# Patient Record
Sex: Male | Born: 2000 | Race: White | Hispanic: No | Marital: Single | Smoking: Never smoker
Health system: Southern US, Community
[De-identification: ages and names within clinical notes are randomized; demographics above are authoritative.]

---
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
## 2019-02-26 ENCOUNTER — Encounter: Payer: Self-pay | Admitting: Emergency Medicine

## 2019-02-26 ENCOUNTER — Other Ambulatory Visit: Payer: Self-pay

## 2019-02-26 ENCOUNTER — Emergency Department (INDEPENDENT_AMBULATORY_CARE_PROVIDER_SITE_OTHER)
Admission: EM | Admit: 2019-02-26 | Discharge: 2019-02-26 | Disposition: A | Discharge status: Home / Self Care | Payer: BLUE CROSS/BLUE SHIELD | Source: Home / Self Care | Attending: Family Medicine | Admitting: Family Medicine

## 2019-02-26 ENCOUNTER — Emergency Department (INDEPENDENT_AMBULATORY_CARE_PROVIDER_SITE_OTHER): Payer: BLUE CROSS/BLUE SHIELD

## 2019-02-26 DIAGNOSIS — R059 Cough, unspecified: Secondary | ICD-10-CM

## 2019-02-26 DIAGNOSIS — R05 Cough: Secondary | ICD-10-CM

## 2019-02-26 DIAGNOSIS — R69 Illness, unspecified: Secondary | ICD-10-CM

## 2019-02-26 DIAGNOSIS — J111 Influenza due to unidentified influenza virus with other respiratory manifestations: Secondary | ICD-10-CM

## 2019-02-26 DIAGNOSIS — R509 Fever, unspecified: Secondary | ICD-10-CM

## 2019-02-26 LAB — POCT INFLUENZA A/B
Influenza A, POC: NEGATIVE
Influenza B, POC: NEGATIVE

## 2019-02-26 MED ORDER — OSELTAMIVIR PHOSPHATE 75 MG PO CAPS: 75.0000 mg | ORAL_CAPSULE | Freq: Two times a day (BID) | ORAL | 0 refills | Status: DC

## 2019-02-26 NOTE — Discharge Instructions (Addendum)
Take plain guaifenesin (1200mg  extended release tabs such as Mucinex) twice daily, with plenty of water, for cough and congestion.  May add Pseudoephedrine (30mg , one or two every 4 to 6 hours) for sinus congestion.  Get adequate rest.   May use Afrin nasal spray (or generic oxymetazoline) each morning for about 5 days and then discontinue.  Also recommend using saline nasal spray several times daily and saline nasal irrigation (AYR is a common brand).  Use Flonase nasal spray each morning after using Afrin nasal spray and saline nasal irrigation. Try warm salt water gargles for sore throat.  Stop all antihistamines for now, and other non-prescription cough/cold preparations. May take Ibuprofen 200mg , 4 tabs every 8 hours with food for body aches, fever, etc. May take Delsym Cough Suppressant at bedtime for nighttime cough.   Recommend a flu shot when well.

## 2019-02-26 NOTE — ED Provider Notes (Signed)
[name]    CSN: [id number] Arrival date & time: 02/26/19  1140     History   Chief Complaint Chief Complaint  Patient presents with  . Cough    HPI Terry Joseph is a 18 y.o. male.   Patient awoke yesterday with a partly productive cough and fatigue.  He has developed fever to 102/chills, nasal congestion, and myalgias.  He denies pleuritic pain or shortness of breath.  He has a history of seasonal rhinitis.  The history is provided by the patient.    Past medical history negative  Active problems:  none    Surgical history:  none     Home Medications    Prior to Admission medications   Medication Sig Start Date End Date Taking? Authorizing Provider  oseltamivir (TAMIFLU) 75 MG capsule Take 1 capsule (75 mg total) by mouth every 12 (twelve) hours. 02/26/19   Lattie Haw, MD    Family History Family History  Problem Relation Age of Onset  . Hypertension Mother     Social History Social History   Tobacco Use  . Smoking status: Never Smoker  . Smokeless tobacco: Never Used  Substance Use Topics  . Alcohol use: Never    Frequency: Never  . Drug use: Never     Allergies   Patient has no allergy information on record.   Review of Systems Review of Systems No sore throat + cough No pleuritic pain No wheezing + nasal congestion + post-nasal drainage No sinus pain/pressure No itchy/red eyes No earache No hemoptysis No SOB + fever, + chills No nausea No vomiting No abdominal pain No diarrhea No urinary symptoms No skin rash + fatigue + myalgias No headache Used OTC meds without relief   Physical Exam Triage Vital Signs ED Triage Vitals  Enc Vitals Group     BP 02/26/19 1208 119/77     Pulse Rate 02/26/19 1208 78     Resp --      Temp 02/26/19 1208 98.9 F (37.2 C)     Temp Source 02/26/19 1208 Oral     SpO2 02/26/19 1208 100 %     Weight 02/26/19 1214 130 lb (59 kg)     Height 02/26/19 1214 5\' 9"  (1.753  m)     Head Circumference --      Peak Flow --      Pain Score 02/26/19 1214 2     Pain Loc --      Pain Edu? --      Excl. in GC? --    No data found.  Updated Vital Signs BP 119/77 (BP Location: Right Arm)   Pulse 78   Temp 98.9 F (37.2 C) (Oral)   Ht 5\' 9"  (1.753 m)   Wt 59 kg   SpO2 100%   BMI 19.20 kg/m   Visual Acuity Right Eye Distance:   Left Eye Distance:   Bilateral Distance:    Right Eye Near:   Left Eye Near:    Bilateral Near:     Physical Exam Nursing notes and Vital Signs reviewed. Appearance:  Patient appears stated age, and in no acute distress Eyes:  Pupils are equal, round, and reactive to light and accomodation.  Extraocular movement is intact.  Conjunctivae are not inflamed  Ears:  Canals normal.  Tympanic membranes normal.  Nose:  Mildly congested turbinates.  No sinus tenderness.   Pharynx:  Normal Neck:  Supple.  Enlarged posterior/lateral nodes are palpated bilaterally, tender  to palpation on the left.   Lungs:  Clear to auscultation.  Breath sounds are equal.  Moving air well. Heart:  Regular rate and rhythm without murmurs, rubs, or gallops.  Abdomen:  Nontender without masses or hepatosplenomegaly.  Bowel sounds are present.  No CVA or flank tenderness.  Extremities:  No edema.  Skin:  No rash present.    UC Treatments / Results  Labs (all labs ordered are listed, but only abnormal results are displayed) Labs Reviewed  NOVEL CORONAVIRUS, NAA (HOSPITAL ORDER, SEND-OUT TO REF LAB)  POCT INFLUENZA A/B negative    EKG None  Radiology Dg Chest 2 View  Result Date: 02/26/2019 CLINICAL DATA:  Cough and fever EXAM: CHEST - 2 VIEW COMPARISON:  None. FINDINGS: Lungs are clear. Heart size and pulmonary vascularity are normal. No adenopathy. There is midthoracic levoscoliosis. IMPRESSION: No edema or consolidation. Electronically Signed   By: Bretta Bang III M.D.   On: 02/26/2019 12:59    Procedures Procedures (including critical  care time)  Medications Ordered in UC Medications - No data to display  Initial Impression / Assessment and Plan / UC Course  I have reviewed the triage vital signs and the nursing notes.  Pertinent labs & imaging results that were available during my care of the patient were reviewed by me and considered in my medical decision making (see chart for details).    Patient meets qualifications for COVID-19 testing and PUI. Begin Tamiflu Call PCP if not improved one week.    Final Clinical Impressions(s) / UC Diagnoses   Final diagnoses:  Cough  Influenza-like illness     Discharge Instructions     Take plain guaifenesin (1200mg  extended release tabs such as Mucinex) twice daily, with plenty of water, for cough and congestion.  May add Pseudoephedrine (30mg , one or two every 4 to 6 hours) for sinus congestion.  Get adequate rest.   May use Afrin nasal spray (or generic oxymetazoline) each morning for about 5 days and then discontinue.  Also recommend using saline nasal spray several times daily and saline nasal irrigation (AYR is a common brand).  Use Flonase nasal spray each morning after using Afrin nasal spray and saline nasal irrigation. Try warm salt water gargles for sore throat.  Stop all antihistamines for now, and other non-prescription cough/cold preparations. May take Ibuprofen 200mg , 4 tabs every 8 hours with food for body aches, fever, etc. May take Delsym Cough Suppressant at bedtime for nighttime cough.   Recommend a flu shot when well.     ED Prescriptions    Medication Sig Dispense Auth. Provider   oseltamivir (TAMIFLU) 75 MG capsule Take 1 capsule (75 mg total) by mouth every 12 (twelve) hours. 10 capsule Lattie Haw, MD         Lattie Haw, MD 02/27/19 [phone]

## 2019-02-26 NOTE — ED Triage Notes (Signed)
Cough, fever 102, body aches, chills started yesterday

## 2019-03-11 ENCOUNTER — Telehealth: Payer: Self-pay

## 2019-03-11 LAB — SARS CORONAVIRUS WITH COV-2 RNA, QUAL REAL TIME RT-PCR
Overall Result:: NOT DETECTED
Pan-SARS RNA: NEGATIVE
SARS Cov2 RNA: NEGATIVE

## 2019-03-11 NOTE — Telephone Encounter (Signed)
Pt was called and notified of negative lab results.  Per patient with Terry Joseph, PAC as a witness pt would like work note explaining that he was tested and has been in quarantine since testing and is free to go back to [employer].

## 2019-09-01 ENCOUNTER — Other Ambulatory Visit: Payer: Self-pay

## 2019-09-01 ENCOUNTER — Emergency Department (INDEPENDENT_AMBULATORY_CARE_PROVIDER_SITE_OTHER)
Admission: EM | Admit: 2019-09-01 | Discharge: 2019-09-01 | Disposition: A | Discharge status: Home / Self Care | Payer: BC Managed Care – PPO | Source: Home / Self Care | Attending: Family Medicine | Admitting: Family Medicine

## 2019-09-01 DIAGNOSIS — Z049 Encounter for examination and observation for unspecified reason: Secondary | ICD-10-CM

## 2019-09-01 NOTE — ED Triage Notes (Signed)
Sore throat and fever started a couple nights ago.  Sinus pressure and nasal drainage yesterday.

## 2019-09-01 NOTE — Discharge Instructions (Addendum)
If cold like symptoms begin, try the following: Take plain guaifenesin (1200mg  extended release tabs such as Mucinex) twice daily, with plenty of water, for cough and congestion.  May add Pseudoephedrine (30mg , one or two every 4 to 6 hours) for sinus congestion.  Get adequate rest.   May use Afrin nasal spray (or generic oxymetazoline) each morning for about 5 days and then discontinue.  Also recommend using saline nasal spray several times daily and saline nasal irrigation (AYR is a common brand).  Use Flonase nasal spray each morning after using Afrin nasal spray and saline nasal irrigation. Try warm salt water gargles for sore throat.  Stop all antihistamines for now, and other non-prescription cough/cold preparations. May take Ibuprofen 200mg , 4 tabs every 8 hours with food for sore throat, headache, etc. May take Delsym Cough Suppressant at bedtime for nighttime cough.

## 2019-09-01 NOTE — ED Provider Notes (Signed)
[facility]    CSN: [id number] Arrival date & time: 09/01/19  1659      History   Chief Complaint Chief Complaint  Patient presents with  . Sore Throat  . Fever  . Nasal Congestion    HPI Terry Joseph is a 18 y.o. male.   Two days ago patient developed a sore throat, now resolved, and yesterday he had a low grade fever and sinus congestion, now resolved.  He denies cough, chest tightness, and changes in taste/smell.  He now feels well.  The history is provided by the patient.    History reviewed. No pertinent past medical history.  There are no active problems to display for this patient.   History reviewed. No pertinent surgical history.     Home Medications    Prior to Admission medications   Medication Sig Start Date End Date Taking? Authorizing Provider  oseltamivir (TAMIFLU) 75 MG capsule Take 1 capsule (75 mg total) by mouth every 12 (twelve) hours. 02/26/19   Kandra Nicolas, MD    Family History Family History  Problem Relation Age of Onset  . Hypertension Mother     Social History Social History   Tobacco Use  . Smoking status: Never Smoker  . Smokeless tobacco: Never Used  Substance Use Topics  . Alcohol use: Never    Frequency: Never  . Drug use: Never     Allergies   Patient has no known allergies.   Review of Systems Review of Systems + sore throat, resolved No cough No pleuritic pain No wheezing + nasal congestion ? post-nasal drainage + sinus pain/pressure, resolved No itchy/red eyes No earache No hemoptysis No SOB + fever resolved No nausea No vomiting No abdominal pain No diarrhea No urinary symptoms No skin rash No fatigue No myalgias No headache    Physical Exam Triage Vital Signs ED Triage Vitals [09/01/19 1727]  Enc Vitals Group     BP 124/82     Pulse Rate 87     Resp 20     Temp 98.9 F (37.2 C)     Temp Source Oral     SpO2 98 %     Weight 120 lb (54.4 kg)     Height 5\' 9"   (1.753 m)     Head Circumference      Peak Flow      Pain Score 0     Pain Loc      Pain Edu?      Excl. in [name]?    No data found.  Updated Vital Signs BP 124/82 (BP Location: Right Arm)   Pulse 87   Temp 98.9 F (37.2 C) (Oral)   Resp 20   Ht 5\' 9"  (1.753 m)   Wt 54.4 kg   SpO2 98%   BMI 17.72 kg/m   Visual Acuity Right Eye Distance:   Left Eye Distance:   Bilateral Distance:    Right Eye Near:   Left Eye Near:    Bilateral Near:     Physical Exam Nursing notes and Vital Signs reviewed. Appearance:  Patient appears stated age, and in no acute distress Eyes:  Pupils are equal, round, and reactive to light and accomodation.  Extraocular movement is intact.  Conjunctivae are not inflamed  Ears:  Canals normal.  Tympanic membranes normal.  Nose:   Normal turbinates.  No sinus tenderness. Pharynx:  Normal Neck:  Supple.  No adenopathy  Lungs:  Clear to auscultation.  Breath sounds  are equal.  Moving air well. Heart:  Regular rate and rhythm without murmurs, rubs, or gallops.  Abdomen:  Nontender without masses or hepatosplenomegaly.  Bowel sounds are present.  No CVA or flank tenderness.  Extremities:  No edema.  Skin:  No rash present.    UC Treatments / Results  Labs (all labs ordered are listed, but only abnormal results are displayed) Labs Reviewed - No data to display  EKG   Radiology No results found.  Procedures Procedures (including critical care time)  Medications Ordered in UC Medications - No data to display  Initial Impression / Assessment and Plan / UC Course  I have reviewed the triage vital signs and the nursing notes.  Pertinent labs & imaging results that were available during my care of the patient were reviewed by me and considered in my medical decision making (see chart for details).    ?early viral URI. Patient has normal exam. Reassurance.  Followup with Family Doctor if not improved in one week.    Final Clinical  Impressions(s) / UC Diagnoses   Final diagnoses:  Suspected condition not found     Discharge Instructions     If cold like symptoms begin, try the following: Take plain guaifenesin (1200mg  extended release tabs such as Mucinex) twice daily, with plenty of water, for cough and congestion.  May add Pseudoephedrine (30mg , one or two every 4 to 6 hours) for sinus congestion.  Get adequate rest.   May use Afrin nasal spray (or generic oxymetazoline) each morning for about 5 days and then discontinue.  Also recommend using saline nasal spray several times daily and saline nasal irrigation (AYR is a common brand).  Use Flonase nasal spray each morning after using Afrin nasal spray and saline nasal irrigation. Try warm salt water gargles for sore throat.  Stop all antihistamines for now, and other non-prescription cough/cold preparations. May take Ibuprofen 200mg , 4 tabs every 8 hours with food for sore throat, headache, etc. May take Delsym Cough Suppressant at bedtime for nighttime cough.     ED Prescriptions    None        , MD 09/03/19 2055

## 2020-05-17 ENCOUNTER — Other Ambulatory Visit: Payer: Self-pay

## 2020-05-17 ENCOUNTER — Emergency Department
Admission: EM | Admit: 2020-05-17 | Discharge: 2020-05-17 | Discharge status: Absent without leave | Payer: Worker's Compensation | Source: Home / Self Care

## 2020-07-14 IMAGING — DX CHEST - 2 VIEW
2 series · 2 of 2 positions shown · non-contrast
Comparison: None.

CLINICAL DATA: Cough and fever

EXAM:
CHEST - 2 VIEW

[chest pa]
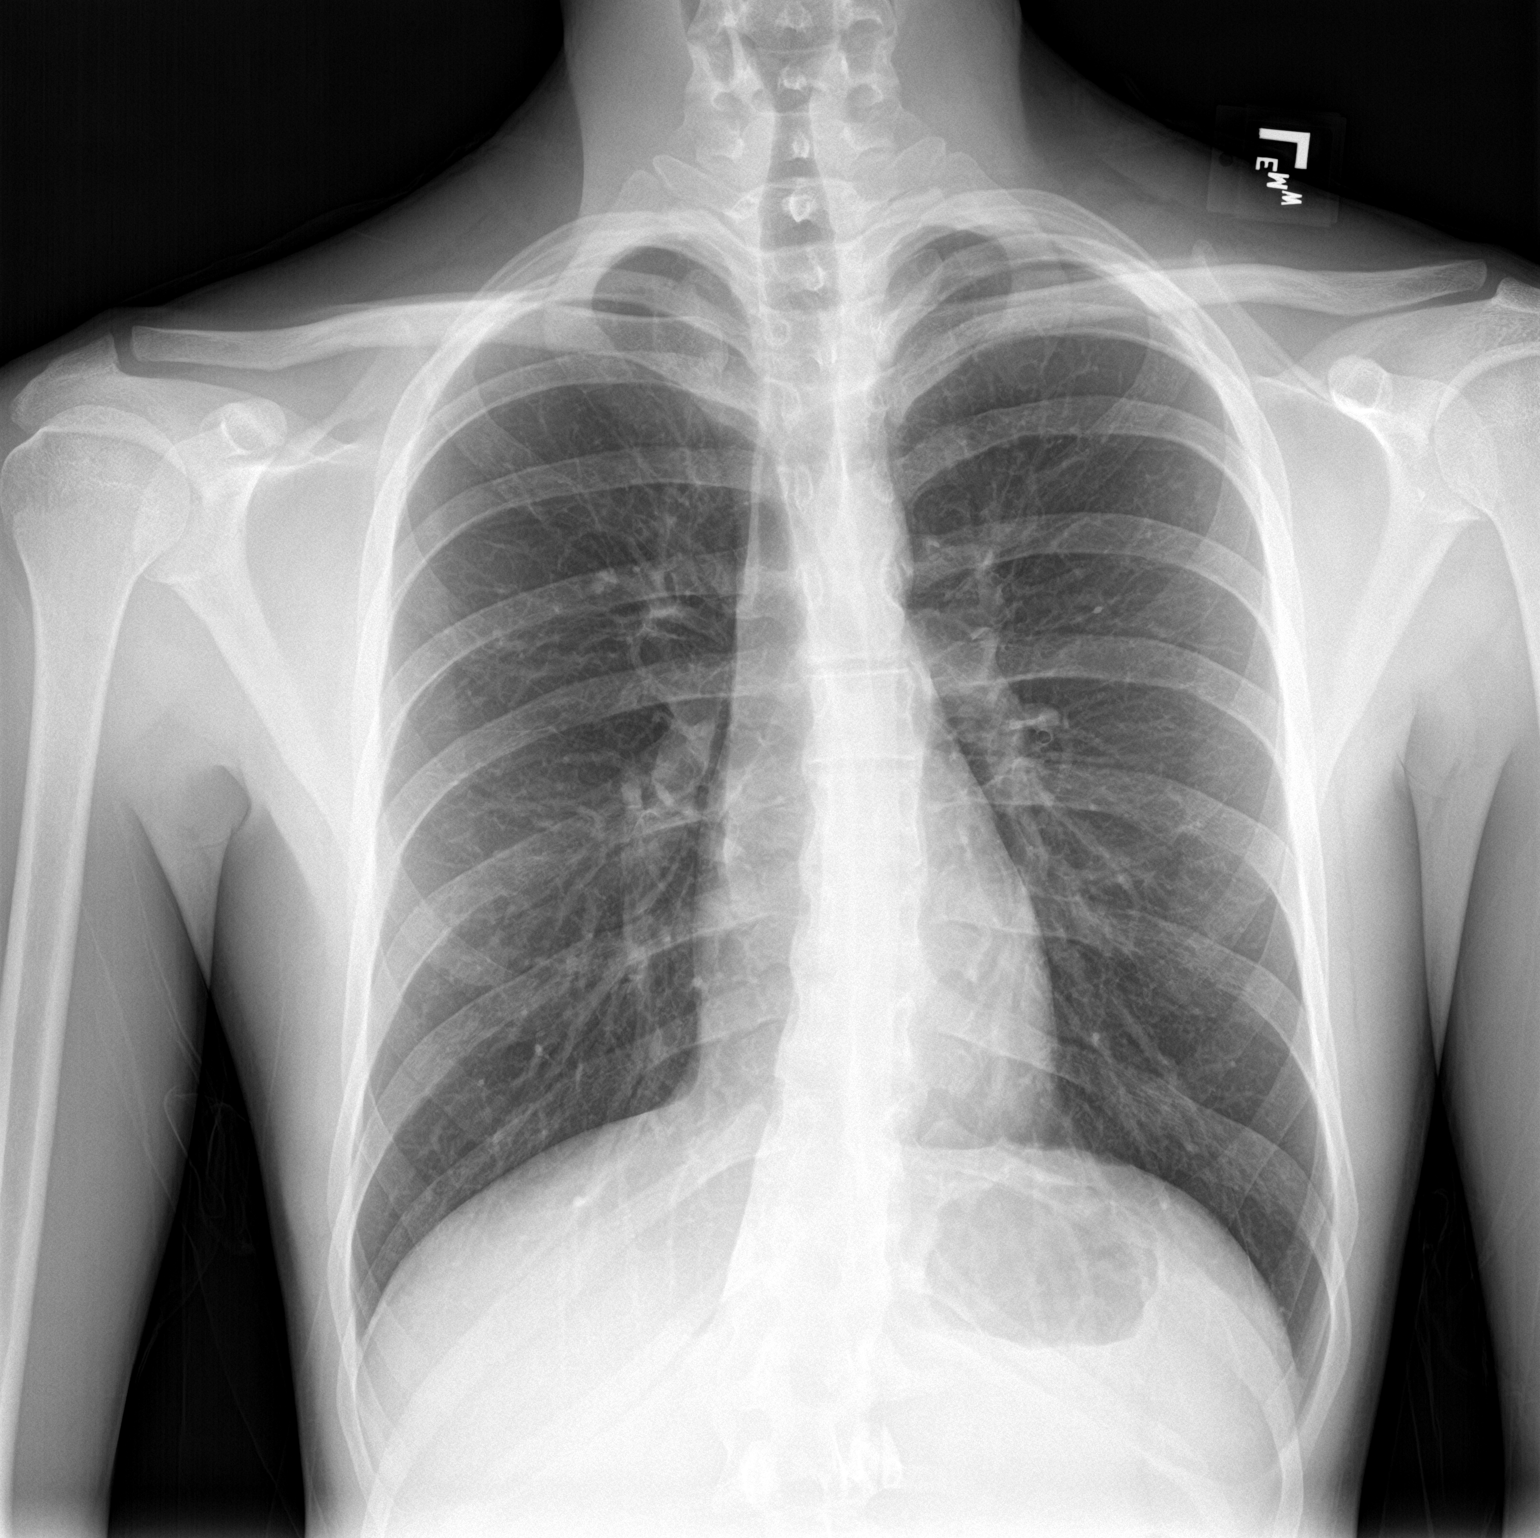

[chest lat]
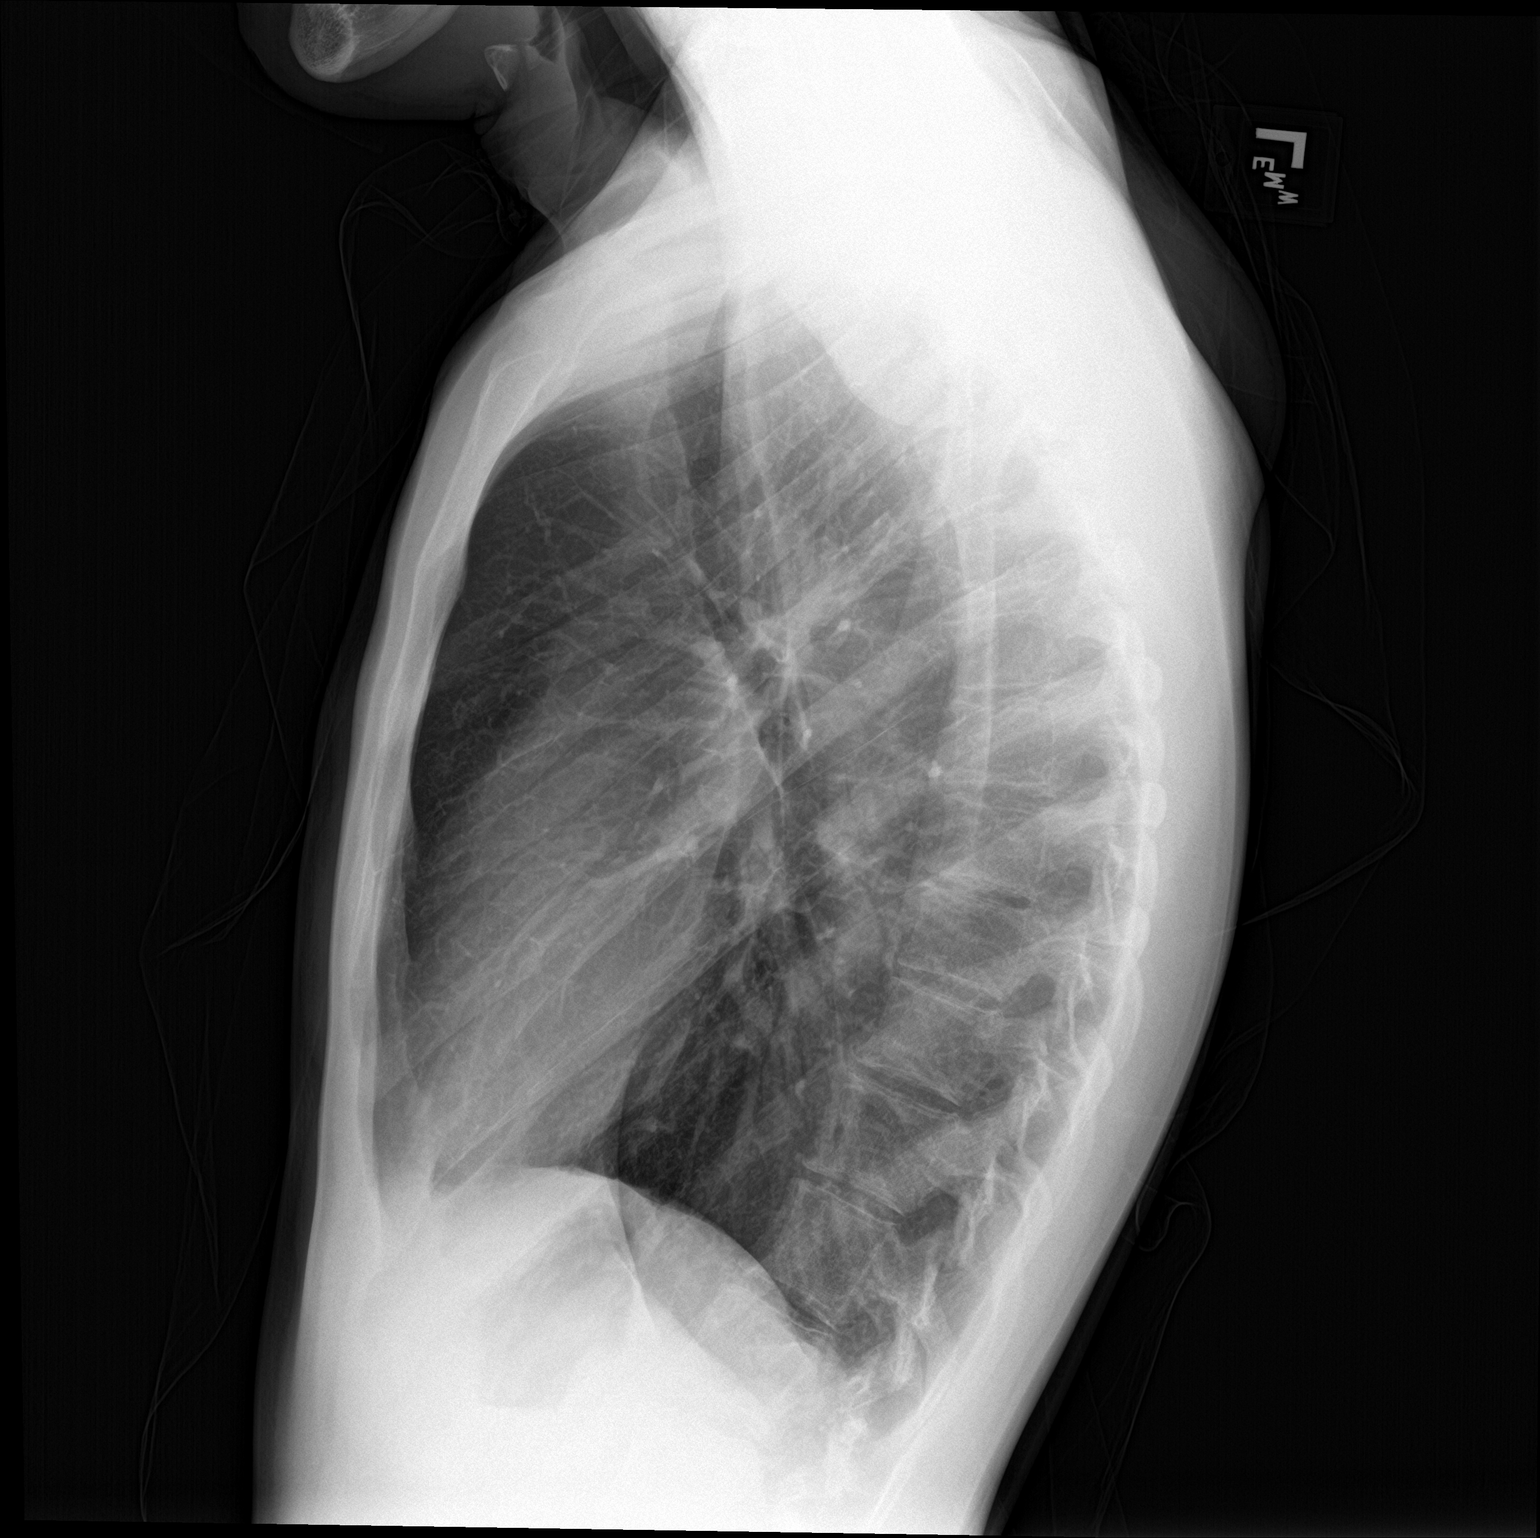

[2 of 2 positions shown; findings below may reference images not displayed]

FINDINGS: Lungs are clear. Heart size and pulmonary vascularity are normal. No
adenopathy. There is midthoracic levoscoliosis.
IMPRESSION: No edema or consolidation.

## 2020-08-17 ENCOUNTER — Other Ambulatory Visit: Payer: Self-pay

## 2020-08-17 ENCOUNTER — Emergency Department (INDEPENDENT_AMBULATORY_CARE_PROVIDER_SITE_OTHER)
Admission: EM | Admit: 2020-08-17 | Discharge: 2020-08-17 | Disposition: A | Discharge status: Home / Self Care | Payer: BC Managed Care – PPO | Source: Home / Self Care | Attending: Family Medicine | Admitting: Family Medicine

## 2020-08-17 DIAGNOSIS — B070 Plantar wart: Secondary | ICD-10-CM

## 2020-08-17 MED ORDER — IMIQUIMOD 5 % EX CREA: TOPICAL_CREAM | CUTANEOUS | 1 refills | Status: DC

## 2020-08-17 NOTE — ED Triage Notes (Signed)
Pt c/o bump on bottom of RT foot x 2 mos. Think it may be a cyst. Hurts to walk on it. Pain 6/10

## 2020-08-17 NOTE — ED Provider Notes (Signed)
[name]    CSN: [id number] Arrival date & time: 08/17/20  1547      History   Chief Complaint Chief Complaint  Patient presents with  . Foot Pain    RT    HPI Terry Joseph is a 19 y.o. male.   Patient complains of a "bump" on the plantar surface of his right foot that has been present for about two months.  The lesion has gradually increased in size and become more painful.  The history is provided by the patient.    History reviewed. No pertinent past medical history.  There are no problems to display for this patient.   History reviewed. No pertinent surgical history.     Home Medications    Prior to Admission medications   Medication Sig Start Date End Date Taking? Authorizing Provider  imiquimod (ALDARA) 5 % cream Apply to affected area three times weekly.  May use up to 16 weeks. 08/17/20 08/17/21  Lattie Haw, MD  oseltamivir (TAMIFLU) 75 MG capsule Take 1 capsule (75 mg total) by mouth every 12 (twelve) hours. 02/26/19   Lattie Haw, MD    Family History Family History  Problem Relation Age of Onset  . Hypertension Mother     Social History Social History   Tobacco Use  . Smoking status: Never Smoker  . Smokeless tobacco: Never Used  Vaping Use  . Vaping Use: Never used  Substance Use Topics  . Alcohol use: Never  . Drug use: Never     Allergies   Patient has no known allergies.   Review of Systems Review of Systems  Constitutional: Negative for chills, diaphoresis, fatigue and fever.  Musculoskeletal: Negative for gait problem and joint swelling.  Skin: Negative for color change.       Skin lesion right foot  All other systems reviewed and are negative.    Physical Exam Triage Vital Signs ED Triage Vitals  Enc Vitals Group     BP 08/17/20 1625 112/76     Pulse Rate 08/17/20 1625 74     Resp 08/17/20 1625 17     Temp 08/17/20 1625 98.8 F (37.1 C)     Temp Source 08/17/20 1625 Oral     SpO2 08/17/20  1625 97 %     Weight --      Height --      Head Circumference --      Peak Flow --      Pain Score 08/17/20 1627 6     Pain Loc --      Pain Edu? --      Excl. in GC? --    No data found.  Updated Vital Signs BP 112/76 (BP Location: Left Arm)   Pulse 74   Temp 98.8 F (37.1 C) (Oral)   Resp 17   SpO2 97%   Visual Acuity Right Eye Distance:   Left Eye Distance:   Bilateral Distance:    Right Eye Near:   Left Eye Near:    Bilateral Near:     Physical Exam Vitals and nursing note reviewed.  Constitutional:      General: He is not in acute distress. Eyes:     Pupils: Pupils are equal, round, and reactive to light.  Cardiovascular:     Rate and Rhythm: Normal rate.  Pulmonary:     Effort: Pulmonary effort is normal.  Musculoskeletal:     Right lower leg: No edema.  Left lower leg: No edema.       Feet:     Comments: Plantar surface of the right foot has a 41mm diameter benign appearing nodule with a verrucous surface over the MTP joints.  Skin:    General: Skin is warm and dry.     Findings: Lesion present. No erythema.  Neurological:     Mental Status: He is alert.      UC Treatments / Results  Labs (all labs ordered are listed, but only abnormal results are displayed) Labs Reviewed - No data to display  EKG   Radiology No results found.  Procedures Procedures (including critical care time)  Medications Ordered in UC Medications - No data to display  Initial Impression / Assessment and Plan / UC Course  I have reviewed the triage vital signs and the nursing notes.  Pertinent labs & imaging results that were available during my care of the patient were reviewed by me and considered in my medical decision making (see chart for details).    Discussed options for treating wart. Trial of Aldara.  Followup with dermatologist if not improving.   Final Clinical Impressions(s) / UC Diagnoses   Final diagnoses:  Plantar wart of right foot      Discharge Instructions     If the wart is painful, try covering it with "moleskin" that has a hole in the middle. This helps to take pressure off the wart.    ED Prescriptions    Medication Sig Dispense Auth. Provider   imiquimod (ALDARA) 5 % cream Apply to affected area three times weekly.  May use up to 16 weeks. 12 each Lattie Haw, MD        Lattie Haw, MD 08/20/20 [phone]

## 2020-08-17 NOTE — Discharge Instructions (Signed)
If the wart is painful, try covering it with "moleskin" that has a hole in the middle. This helps to take pressure off the wart.

## 2021-03-09 ENCOUNTER — Other Ambulatory Visit: Payer: Self-pay

## 2021-03-09 ENCOUNTER — Emergency Department (INDEPENDENT_AMBULATORY_CARE_PROVIDER_SITE_OTHER)
Admission: EM | Admit: 2021-03-09 | Discharge: 2021-03-09 | Disposition: A | Discharge status: Home / Self Care | Payer: BC Managed Care – PPO | Source: Home / Self Care

## 2021-03-09 DIAGNOSIS — R1084 Generalized abdominal pain: Secondary | ICD-10-CM | POA: Diagnosis not present

## 2021-03-09 DIAGNOSIS — S8391XA Sprain of unspecified site of right knee, initial encounter: Secondary | ICD-10-CM | POA: Diagnosis not present

## 2021-03-09 DIAGNOSIS — M5442 Lumbago with sciatica, left side: Secondary | ICD-10-CM

## 2021-03-09 DIAGNOSIS — M5441 Lumbago with sciatica, right side: Secondary | ICD-10-CM

## 2021-03-09 DIAGNOSIS — G8929 Other chronic pain: Secondary | ICD-10-CM

## 2021-03-09 NOTE — ED Triage Notes (Signed)
Patient presents to Urgent Care with complaints of back pain, knee pain, and generalized body aches since the past few days. Patient reports he also thinks he might have IBS, states when he wakes up in the morning he has stomach cramps and it takes him a long time to poop in the mornings. Pt denies pain at this time.

## 2021-03-09 NOTE — Discharge Instructions (Signed)
Eat a well balanced high fiber diet Take metamucil every day Drink plenty of water OTC medicines for knee and backpain

## 2021-07-23 ENCOUNTER — Encounter: Payer: Self-pay | Admitting: Emergency Medicine

## 2021-07-23 ENCOUNTER — Other Ambulatory Visit (HOSPITAL_COMMUNITY)
Admission: RE | Admit: 2021-07-23 | Discharge: 2021-07-23 | Disposition: A | Discharge status: Home / Self Care | Payer: BC Managed Care – PPO | Source: Ambulatory Visit | Attending: Family Medicine | Admitting: Family Medicine

## 2021-07-23 ENCOUNTER — Other Ambulatory Visit: Payer: Self-pay

## 2021-07-23 ENCOUNTER — Emergency Department (INDEPENDENT_AMBULATORY_CARE_PROVIDER_SITE_OTHER)
Admission: EM | Admit: 2021-07-23 | Discharge: 2021-07-23 | Disposition: A | Discharge status: Home / Self Care | Payer: BC Managed Care – PPO | Source: Home / Self Care

## 2021-07-23 DIAGNOSIS — R300 Dysuria: Secondary | ICD-10-CM | POA: Diagnosis not present

## 2021-07-23 DIAGNOSIS — Z202 Contact with and (suspected) exposure to infections with a predominantly sexual mode of transmission: Secondary | ICD-10-CM | POA: Diagnosis not present

## 2021-07-23 LAB — POCT URINALYSIS DIP (MANUAL ENTRY)
Bilirubin, UA: NEGATIVE
Glucose, UA: NEGATIVE mg/dL
Ketones, POC UA: NEGATIVE mg/dL
Nitrite, UA: NEGATIVE
Protein Ur, POC: NEGATIVE mg/dL
Spec Grav, UA: 1.015 (ref 1.010–1.025)
Urobilinogen, UA: 0.2 E.U./dL
pH, UA: 7 (ref 5.0–8.0)

## 2021-07-23 MED ORDER — SULFAMETHOXAZOLE-TRIMETHOPRIM 800-160 MG PO TABS: 1.0000 | ORAL_TABLET | Freq: Two times a day (BID) | ORAL | 0 refills | Status: AC

## 2021-07-23 NOTE — ED Provider Notes (Signed)
[name]    CSN: [id number] Arrival date & time: 07/23/21  1321      History   Chief Complaint Chief Complaint  Patient presents with   Dysuria   SEXUALLY TRANSMITTED DISEASE    HPI Penny Frisbie is a 20 y.o. male.   HPI 20 year old male presents with potential exposure to STD and dysuria.  Patient reports unprotected sex roughly 2 months ago, white penile discharge yesterday, and burning with urination for the past 2 to 3 days.  Past Medical History:  Diagnosis Date   COVID-19 02/2019   also + in 4/22 - COVID vaccine x 2    Patient Active Problem List   Diagnosis Date Noted   Adolescent idiopathic scoliosis of thoracic region 09/15/2016   Underweight 09/15/2016   Decreased vision in both eyes 09/15/2016    History reviewed. No pertinent surgical history.     Home Medications    Prior to Admission medications   Medication Sig Start Date End Date Taking? Authorizing Provider  sulfamethoxazole-trimethoprim (BACTRIM DS) 800-160 MG tablet Take 1 tablet by mouth 2 (two) times daily for 3 days. 07/23/21 07/26/21 Yes Trevor Iha, FNP    Family History Family History  Problem Relation Age of Onset   Hypertension Mother     Social History Social History   Tobacco Use   Smoking status: Never    Passive exposure: Never   Smokeless tobacco: Never  Vaping Use   Vaping Use: Never used  Substance Use Topics   Alcohol use: Not Currently   Drug use: Never     Allergies   Patient has no known allergies.   Review of Systems Review of Systems  Genitourinary:  Positive for dysuria, frequency, penile discharge and urgency.  All other systems reviewed and are negative.   Physical Exam Triage Vital Signs ED Triage Vitals  Enc Vitals Group     BP 07/23/21 1351 119/79     Pulse Rate 07/23/21 1351 78     Resp 07/23/21 1351 16     Temp 07/23/21 1351 98.9 F (37.2 C)     Temp Source 07/23/21 1351 Oral     SpO2 07/23/21 1351 97 %      Weight 07/23/21 1354 133 lb (60.3 kg)     Height 07/23/21 1354 5\' 9"  (1.753 m)     Head Circumference --      Peak Flow --      Pain Score 07/23/21 1353 0     Pain Loc --      Pain Edu? --      Excl. in [place]? --    No data found.  Updated Vital Signs BP 119/79 (BP Location: Right Arm)   Pulse 78   Temp 98.9 F (37.2 C) (Oral)   Resp 16   Ht 5\' 9"  (1.753 m)   Wt 133 lb (60.3 kg)   SpO2 97%   BMI 19.64 kg/m   Physical Exam Vitals and nursing note reviewed.  Constitutional:      General: He is not in acute distress.    Appearance: Normal appearance. He is normal weight. He is not ill-appearing.  HENT:     Head: Normocephalic and atraumatic.     Mouth/Throat:     Mouth: Mucous membranes are moist.     Pharynx: Oropharynx is clear.  Eyes:     Extraocular Movements: Extraocular movements intact.     Conjunctiva/sclera: Conjunctivae normal.     Pupils: Pupils are equal, round, and  reactive to light.  Cardiovascular:     Rate and Rhythm: Normal rate and regular rhythm.     Pulses: Normal pulses.     Heart sounds: Normal heart sounds.  Pulmonary:     Effort: Pulmonary effort is normal.     Breath sounds: Normal breath sounds.  Abdominal:     Tenderness: There is no right CVA tenderness or left CVA tenderness.  Musculoskeletal:        General: Normal range of motion.     Cervical back: Normal range of motion and neck supple. No tenderness.  Lymphadenopathy:     Cervical: No cervical adenopathy.  Skin:    General: Skin is warm and dry.  Neurological:     General: No focal deficit present.     Mental Status: He is alert and oriented to person, place, and time.  Psychiatric:        Mood and Affect: Mood normal.        Behavior: Behavior normal.        Thought Content: Thought content normal.     UC Treatments / Results  Labs (all labs ordered are listed, but only abnormal results are displayed) Labs Reviewed  POCT URINALYSIS DIP (MANUAL ENTRY) - Abnormal; Notable  for the following components:      Result Value   Clarity, UA cloudy (*)    Blood, UA small (*)    Leukocytes, UA Moderate (2+) (*)    All other components within normal limits  URINE CULTURE  CYTOLOGY, (ORAL, ANAL, URETHRAL) ANCILLARY ONLY    EKG   Radiology No results found.  Procedures Procedures (including critical care time)  Medications Ordered in UC Medications - No data to display  Initial Impression / Assessment and Plan / UC Course  I have reviewed the triage vital signs and the nursing notes.  Pertinent labs & imaging results that were available during my care of the patient were reviewed by me and considered in my medical decision making (see chart for details).     MDM: 1.  Dysuria-UA revealed moderate blood, moderate leukocytes, culture ordered Rx'd Bactrim; 2.  Potential exposure to STD-Aptima swab ordered for GC chlamydia and trichomonas.  Advised/instructed patient to take medication as directed with food to completion and that we would follow-up with lab results once received.  Patient discharged home, hemodynamically stable. Final Clinical Impressions(s) / UC Diagnoses   Final diagnoses:  Dysuria  Potential exposure to STD     Discharge Instructions      Advised patient to take medication as directed with food to completion.  Encourage patient to increase daily water intake while taking this medication.     ED Prescriptions     Medication Sig Dispense Auth. Provider   sulfamethoxazole-trimethoprim (BACTRIM DS) 800-160 MG tablet Take 1 tablet by mouth 2 (two) times daily for 3 days. 6 tablet Trevor Iha, FNP      PDMP not reviewed this encounter.   Jaray, Boliver, FNP 07/23/21 1435

## 2021-07-23 NOTE — Discharge Instructions (Addendum)
Advised patient to take medication as directed with food to completion.  Encourage patient to increase daily water intake while taking this medication. 

## 2021-07-23 NOTE — ED Triage Notes (Signed)
STD testing requested  Unprotected sex 2 months ago  White penile discharge yesterday  Burning w/ urination the last few days  COVID vaccine x 2  COVID 02/2019 & 03/2021

## 2021-07-25 LAB — URINE CULTURE
MICRO NUMBER:: 12241151
Result:: NO GROWTH
SPECIMEN QUALITY:: ADEQUATE

## 2021-07-26 LAB — CYTOLOGY, (ORAL, ANAL, URETHRAL) ANCILLARY ONLY
Chlamydia: NEGATIVE
Comment: NEGATIVE
Comment: NEGATIVE
Comment: NORMAL
Neisseria Gonorrhea: NEGATIVE
Trichomonas: NEGATIVE

## 2021-07-27 ENCOUNTER — Telehealth: Payer: Self-pay

## 2021-07-27 NOTE — Telephone Encounter (Signed)
Informed of neg lab results. Pt states still sxs and has finished antibiotics. Advised to f/u with PCP or return here for re eval. Pt acknowledged.

## 2021-07-30 ENCOUNTER — Emergency Department (INDEPENDENT_AMBULATORY_CARE_PROVIDER_SITE_OTHER)
Admission: EM | Admit: 2021-07-30 | Discharge: 2021-07-30 | Disposition: A | Discharge status: Home / Self Care | Payer: BC Managed Care – PPO | Source: Home / Self Care | Attending: Family Medicine | Admitting: Family Medicine

## 2021-07-30 ENCOUNTER — Encounter: Payer: Self-pay | Admitting: Emergency Medicine

## 2021-07-30 ENCOUNTER — Other Ambulatory Visit: Payer: Self-pay

## 2021-07-30 DIAGNOSIS — R350 Frequency of micturition: Secondary | ICD-10-CM | POA: Diagnosis not present

## 2021-07-30 LAB — POCT URINALYSIS DIP (MANUAL ENTRY)
Bilirubin, UA: NEGATIVE
Glucose, UA: NEGATIVE mg/dL
Ketones, POC UA: NEGATIVE mg/dL
Nitrite, UA: NEGATIVE
Spec Grav, UA: 1.02 (ref 1.010–1.025)
Urobilinogen, UA: 0.2 E.U./dL
pH, UA: 7 (ref 5.0–8.0)

## 2021-07-30 MED ORDER — CIPROFLOXACIN HCL 500 MG PO TABS: 500.0000 mg | ORAL_TABLET | Freq: Two times a day (BID) | ORAL | 0 refills | Status: DC

## 2021-07-30 NOTE — ED Triage Notes (Signed)
Patient c/o seen and treated for UTI, urine cx was negative, STD testing negative, finished antbs, now sx's have returned.

## 2021-07-30 NOTE — Discharge Instructions (Addendum)
Lots of water Take antibiotic 2 times a day for a week  Check MyChart for your culture report  Repeat urinalysis in 2 weeks is recommended.  I have included a family practice office that is taking new patients

## 2021-07-31 NOTE — ED Provider Notes (Signed)
[facility]    CSN: [id number] Arrival date & time: 07/30/21  1532      History   Chief Complaint Chief Complaint  Patient presents with   Dysuria    HPI Terry Joseph is a 20 y.o. male.   HPI  Patient was seen here for dysuria.  His urinalysis was abnormal but the urine culture was negative.  He is urethral swab for STD was negative.  He took 3 days of antibiotics prescribed by a provider and states that he continues to have dysuria and frequency.  He is not sexually active.  He is not having any abdominal pain or flank pain.  No history of kidney stones.  No fever or chills, no nausea or vomiting.  No rash  Past Medical History:  Diagnosis Date   COVID-19 02/2019   also + in 4/22 - COVID vaccine x 2    Patient Active Problem List   Diagnosis Date Noted   Adolescent idiopathic scoliosis of thoracic region 09/15/2016   Underweight 09/15/2016   Decreased vision in both eyes 09/15/2016    History reviewed. No pertinent surgical history.     Home Medications    Prior to Admission medications   Medication Sig Start Date End Date Taking? Authorizing Provider  ciprofloxacin (CIPRO) 500 MG tablet Take 1 tablet (500 mg total) by mouth 2 (two) times daily. 07/30/21  Yes Eustace Moore, MD    Family History Family History  Problem Relation Age of Onset   Hypertension Mother     Social History Social History   Tobacco Use   Smoking status: Never    Passive exposure: Never   Smokeless tobacco: Never  Vaping Use   Vaping Use: Never used  Substance Use Topics   Alcohol use: Not Currently   Drug use: Never     Allergies   Patient has no known allergies.   Review of Systems Review of Systems  See HPI Physical Exam Triage Vital Signs ED Triage Vitals  Enc Vitals Group     BP 07/30/21 1541 120/70     Pulse Rate 07/30/21 1541 79     Resp 07/30/21 1541 18     Temp 07/30/21 1541 98.5 F (36.9 C)     Temp Source 07/30/21 1541 Oral      SpO2 07/30/21 1541 95 %     Weight 07/30/21 1543 130 lb (59 kg)     Height 07/30/21 1543 5\' 9"  (1.753 m)     Head Circumference --      Peak Flow --      Pain Score 07/30/21 1542 7     Pain Loc --      Pain Edu? --      Excl. in GC? --    No data found.  Updated Vital Signs BP 120/70 (BP Location: Right Arm)   Pulse 79   Temp 98.5 F (36.9 C) (Oral)   Resp 18   Ht 5\' 9"  (1.753 m)   Wt 59 kg   SpO2 95%   BMI 19.20 kg/m      Physical Exam Constitutional:      General: He is not in acute distress.    Appearance: He is well-developed.     Comments: Patient is in no distress  HENT:     Head: Normocephalic and atraumatic.     Mouth/Throat:     Comments: Mask is in place Eyes:     Conjunctiva/sclera: Conjunctivae normal.  Pupils: Pupils are equal, round, and reactive to light.  Cardiovascular:     Rate and Rhythm: Normal rate.  Pulmonary:     Effort: Pulmonary effort is normal. No respiratory distress.  Abdominal:     General: Abdomen is flat.  Musculoskeletal:        General: Normal range of motion.     Cervical back: Normal range of motion.  Skin:    General: Skin is warm and dry.  Neurological:     Mental Status: He is alert.  Psychiatric:        Mood and Affect: Mood normal.        Behavior: Behavior normal.     UC Treatments / Results  Labs (all labs ordered are listed, but only abnormal results are displayed) Labs Reviewed  POCT URINALYSIS DIP (MANUAL ENTRY) - Abnormal; Notable for the following components:      Result Value   Clarity, UA cloudy (*)    Blood, UA trace-intact (*)    Protein Ur, POC trace (*)    Leukocytes, UA Moderate (2+) (*)    All other components within normal limits  URINE CULTURE    EKG   Radiology No results found.  Procedures Procedures (including critical care time)  Medications Ordered in UC Medications - No data to display  Initial Impression / Assessment and Plan / UC Course  I have reviewed the triage  vital signs and the nursing notes.  Pertinent labs & imaging results that were available during my care of the patient were reviewed by me and considered in my medical decision making (see chart for details).     His urinalysis remains abnormal with blood and mucus leukocytes in the urine.  We will do another culture.  I am not repeating the STD swab.  I told the patient that if his culture ends up negative he does need follow-up to find out why his urine testing is abnormal without infection. Final Clinical Impressions(s) / UC Diagnoses   Final diagnoses:  Increased urinary frequency     Discharge Instructions      Lots of water Take antibiotic 2 times a day for a week  Check MyChart for your culture report  Repeat urinalysis in 2 weeks is recommended.  I have included a family practice office that is taking new patients   ED Prescriptions     Medication Sig Dispense Auth. Provider   ciprofloxacin (CIPRO) 500 MG tablet Take 1 tablet (500 mg total) by mouth 2 (two) times daily. 14 tablet Eustace Moore, MD      PDMP not reviewed this encounter.   Eustace Moore, MD 07/31/21 [phone]

## 2021-08-02 LAB — URINE CULTURE
MICRO NUMBER:: 12272050
Result:: NO GROWTH
SPECIMEN QUALITY:: ADEQUATE

## 2021-08-03 ENCOUNTER — Telehealth: Payer: Self-pay

## 2021-08-08 ENCOUNTER — Emergency Department (INDEPENDENT_AMBULATORY_CARE_PROVIDER_SITE_OTHER)
Admission: EM | Admit: 2021-08-08 | Discharge: 2021-08-08 | Disposition: A | Discharge status: Home / Self Care | Payer: BC Managed Care – PPO | Source: Home / Self Care | Attending: Family Medicine | Admitting: Family Medicine

## 2021-08-08 ENCOUNTER — Other Ambulatory Visit (HOSPITAL_COMMUNITY)
Admission: RE | Admit: 2021-08-08 | Discharge: 2021-08-08 | Disposition: A | Discharge status: Home / Self Care | Payer: BC Managed Care – PPO | Source: Ambulatory Visit | Attending: Family Medicine | Admitting: Family Medicine

## 2021-08-08 ENCOUNTER — Other Ambulatory Visit: Payer: Self-pay

## 2021-08-08 DIAGNOSIS — Z113 Encounter for screening for infections with a predominantly sexual mode of transmission: Secondary | ICD-10-CM | POA: Diagnosis not present

## 2021-08-08 DIAGNOSIS — N342 Other urethritis: Secondary | ICD-10-CM

## 2021-08-08 DIAGNOSIS — R369 Urethral discharge, unspecified: Secondary | ICD-10-CM | POA: Diagnosis present

## 2021-08-08 NOTE — ED Triage Notes (Signed)
Pt last seen at [facility] on 07/30/21 for ongoing uti sxs and was tx with cipro. Pt has finished the course of cipro and reports that sxs are not resolved. C/o yellow/green penile discharge, urinary urgency and intermittent dysuria. Pt notes penile pain when sitting in hard chairs. No hematuria or urinary frequency. Previous STD testing was a self swab.

## 2021-08-08 NOTE — ED Provider Notes (Signed)
[facility]    CSN: [id number] Arrival date & time: 08/08/21  1541      History   Chief Complaint Chief Complaint  Patient presents with   Penile Discharge   Dysuria    HPI Terry Joseph is a 20 y.o. male.   HPI  Terry Joseph is here for follow-up.  He continues to complain of urinary discomfort and yellow penile discharge.  He states he has not had any sexual encounters for 2 to 3 months.  He has been here twice before.  Urine cultures x2 are negative.  Cytology swab was also negative.  He was treated initially with a week of Bactrim.  He then he was treated with a week of Cipro.  None of these have gotten rid of his symptoms.  He is frustrated that we have been unable to get to the bottom of his symptoms.  I have suggested that he might need urology  Past Medical History:  Diagnosis Date   COVID-19 02/2019   also + in 4/22 - COVID vaccine x 2    Patient Active Problem List   Diagnosis Date Noted   Adolescent idiopathic scoliosis of thoracic region 09/15/2016   Underweight 09/15/2016   Decreased vision in both eyes 09/15/2016    History reviewed. No pertinent surgical history.     Home Medications    Prior to Admission medications   Not on File    Family History Family History  Problem Relation Age of Onset   Hypertension Mother     Social History Social History   Tobacco Use   Smoking status: Never    Passive exposure: Never   Smokeless tobacco: Never  Vaping Use   Vaping Use: Never used  Substance Use Topics   Alcohol use: Not Currently   Drug use: Never     Allergies   Patient has no known allergies.   Review of Systems Review of Systems  See HPI Physical Exam Triage Vital Signs ED Triage Vitals  Enc Vitals Group     BP 08/08/21 1554 120/83     Pulse Rate 08/08/21 1554 70     Resp 08/08/21 1554 18     Temp 08/08/21 1554 98.3 F (36.8 C)     Temp Source 08/08/21 1554 Oral     SpO2 08/08/21 1554 97 %     Weight  08/08/21 1555 121 lb 1.6 oz (54.9 kg)     Height --      Head Circumference --      Peak Flow --      Pain Score 08/08/21 1600 4     Pain Loc --      Pain Edu? --      Excl. in GC? --    No data found.  Updated Vital Signs BP 120/83 (BP Location: Right Arm)   Pulse 70   Temp 98.3 F (36.8 C) (Oral)   Resp 18   Wt 54.9 kg   SpO2 97%   BMI 17.88 kg/m       Physical Exam Constitutional:      General: He is not in acute distress.    Appearance: He is well-developed.  HENT:     Head: Normocephalic and atraumatic.  Eyes:     Conjunctiva/sclera: Conjunctivae normal.     Pupils: Pupils are equal, round, and reactive to light.  Cardiovascular:     Rate and Rhythm: Normal rate.  Pulmonary:     Effort: Pulmonary effort is normal. No  respiratory distress.  Abdominal:     General: There is no distension.     Palpations: Abdomen is soft.  Genitourinary:    Comments: Technique for self swab is reviewed with patient.  He prefers to swab himself Musculoskeletal:        General: Normal range of motion.     Cervical back: Normal range of motion.  Skin:    General: Skin is warm and dry.  Neurological:     Mental Status: He is alert.  Psychiatric:        Mood and Affect: Mood normal.        Behavior: Behavior normal.     UC Treatments / Results  Labs (all labs ordered are listed, but only abnormal results are displayed) Labs Reviewed  CYTOLOGY, (ORAL, ANAL, URETHRAL) ANCILLARY ONLY    EKG   Radiology No results found.  Procedures Procedures (including critical care time)  Medications Ordered in UC Medications - No data to display  Initial Impression / Assessment and Plan / UC Course  I have reviewed the triage vital signs and the nursing notes.  Pertinent labs & imaging results that were available during my care of the patient were reviewed by me and considered in my medical decision making (see chart for details).     Persistent leukocytes in urine,  symptoms are of urethritis with negative testing to date.  He has not been treated for STDs.  States that he is not sexually active.  We will do 1 more swab and then refer to urology if he fails to improve Final Clinical Impressions(s) / UC Diagnoses   Final diagnoses:  Urethritis     Discharge Instructions      Drink plenty of water Check my Chart for test results You will be treated based on the culture report.  You will be called if any tests are positive If this problem persists you need to see a primary care doctor or urologist     ED Prescriptions   None    PDMP not reviewed this encounter.   Eustace Moore, MD 08/08/21 2001

## 2021-08-08 NOTE — Discharge Instructions (Addendum)
Drink plenty of water Check my Chart for test results You will be treated based on the culture report.  You will be called if any tests are positive If this problem persists you need to see a primary care doctor or urologist

## 2021-08-10 LAB — CYTOLOGY, (ORAL, ANAL, URETHRAL) ANCILLARY ONLY
Chlamydia: NEGATIVE
Comment: NEGATIVE
Comment: NEGATIVE
Comment: NORMAL
Neisseria Gonorrhea: NEGATIVE
Trichomonas: NEGATIVE

## 2022-01-09 ENCOUNTER — Other Ambulatory Visit: Payer: Self-pay

## 2022-01-09 ENCOUNTER — Emergency Department (INDEPENDENT_AMBULATORY_CARE_PROVIDER_SITE_OTHER)
Admission: EM | Admit: 2022-01-09 | Discharge: 2022-01-09 | Disposition: A | Discharge status: Home / Self Care | Payer: BC Managed Care – PPO | Source: Home / Self Care | Attending: Family Medicine | Admitting: Family Medicine

## 2022-01-09 DIAGNOSIS — J069 Acute upper respiratory infection, unspecified: Secondary | ICD-10-CM | POA: Diagnosis not present

## 2022-01-09 MED ORDER — AMOXICILLIN 875 MG PO TABS: 875.0000 mg | ORAL_TABLET | Freq: Two times a day (BID) | ORAL | 0 refills | Status: AC

## 2022-01-09 MED ORDER — AMOXICILLIN 875 MG PO TABS: 875.0000 mg | ORAL_TABLET | Freq: Two times a day (BID) | ORAL | 0 refills | Status: DC

## 2022-01-09 NOTE — ED Provider Notes (Signed)
[name]    CSN: [id number] Arrival date & time: 01/09/22  1741      History   Chief Complaint Chief Complaint  Patient presents with   Fever   Nasal Congestion    HPI Terry [facility] is a 21 y.o. male.   HPI  Cough cold runny nose sore throat for the last 5 days.  Not getting any better.  Has missed work over the weekend.  Needs a note for work.  He did have some fever and body aches.  This is improved.  Past Medical History:  Diagnosis Date   COVID-19 02/2019   also + in 4/22 - COVID vaccine x 2    Patient Active Problem List   Diagnosis Date Noted   Adolescent idiopathic scoliosis of thoracic region 09/15/2016   Underweight 09/15/2016   Decreased vision in both eyes 09/15/2016    History reviewed. No pertinent surgical history.     Home Medications    Prior to Admission medications   Medication Sig Start Date End Date Taking? Authorizing Provider  amoxicillin (AMOXIL) 875 MG tablet Take 1 tablet (875 mg total) by mouth 2 (two) times daily. 01/09/22   Eustace Moore, MD    Family History Family History  Problem Relation Age of Onset   Hypertension Mother     Social History Social History   Tobacco Use   Smoking status: Never    Passive exposure: Never   Smokeless tobacco: Never  Vaping Use   Vaping Use: Never used  Substance Use Topics   Alcohol use: Not Currently   Drug use: Never     Allergies   Patient has no known allergies.   Review of Systems Review of Systems  See HPI Physical Exam Triage Vital Signs ED Triage Vitals  Enc Vitals Group     BP 01/09/22 1817 125/76     Pulse Rate 01/09/22 1817 71     Resp 01/09/22 1817 17     Temp 01/09/22 1817 99 F (37.2 C)     Temp Source 01/09/22 1817 Oral     SpO2 01/09/22 1817 99 %     Weight --      Height --      Head Circumference --      Peak Flow --      Pain Score 01/09/22 1819 0     Pain Loc --      Pain Edu? --      Excl. in GC? --    No data  found.  Updated Vital Signs BP 125/76 (BP Location: Left Arm)    Pulse 71    Temp 99 F (37.2 C) (Oral)    Resp 17    SpO2 99%      Physical Exam Constitutional:      General: He is not in acute distress.    Appearance: He is well-developed.     Comments: Patient is lean  HENT:     Head: Normocephalic and atraumatic.     Right Ear: Tympanic membrane and ear canal normal.     Left Ear: Tympanic membrane normal.     Nose: No congestion.     Mouth/Throat:     Mouth: Mucous membranes are moist.     Pharynx: No posterior oropharyngeal erythema.  Eyes:     Conjunctiva/sclera: Conjunctivae normal.     Pupils: Pupils are equal, round, and reactive to light.  Cardiovascular:     Rate and Rhythm: Normal rate.  Pulmonary:     Effort: Pulmonary effort is normal. No respiratory distress.     Breath sounds: Normal breath sounds. No wheezing or rhonchi.  Abdominal:     General: There is no distension.     Palpations: Abdomen is soft.  Musculoskeletal:        General: Normal range of motion.     Cervical back: Normal range of motion.  Skin:    General: Skin is warm and dry.  Neurological:     Mental Status: He is alert.  Psychiatric:        Mood and Affect: Mood normal.     UC Treatments / Results  Labs (all labs ordered are listed, but only abnormal results are displayed) Labs Reviewed - No data to display  EKG   Radiology No results found.  Procedures Procedures (including critical care time)  Medications Ordered in UC Medications - No data to display  Initial Impression / Assessment and Plan / UC Course  I have reviewed the triage vital signs and the nursing notes.  Pertinent labs & imaging results that were available during my care of the patient were reviewed by me and considered in my medical decision making (see chart for details).     Discussed with patient has a virus.  Encouraged home care with over-the-counter medications.  Note is given for work.  Fill  and take antibiotic if he fails to improve after 7 to 10 days of conservative treatment Final Clinical Impressions(s) / UC Diagnoses   Final diagnoses:  Viral upper respiratory tract infection     Discharge Instructions      Drink plenty of fluid Take over-the-counter cough and cold medicine as needed I have given you a written prescription for amoxicillin.  Take the antibiotic if you fail to improve over the next few days    ED Prescriptions     Medication Sig Dispense Auth. Provider   amoxicillin (AMOXIL) 875 MG tablet Take 1 tablet (875 mg total) by mouth 2 (two) times daily. 14 tablet Eustace Moore, MD      PDMP not reviewed this encounter.   Eustace Moore, MD 01/09/22 [phone]

## 2022-01-09 NOTE — Discharge Instructions (Signed)
Drink plenty of fluid Take over-the-counter cough and cold medicine as needed I have given you a written prescription for amoxicillin.  Take the antibiotic if you fail to improve over the next few days

## 2022-01-09 NOTE — ED Triage Notes (Signed)
Pt c/o fever and runny nose since Sat night. Tmax 99.8. No known covid or flu exposure. Taking mucinex and ibuprofen prn.
